# Patient Record
Sex: Female | Born: 1985 | Race: Black or African American | Hispanic: No | Marital: Married
Health system: Southern US, Community
[De-identification: ages and names within clinical notes are randomized; demographics above are authoritative.]

## PROBLEM LIST (undated history)

## (undated) DIAGNOSIS — R569 Unspecified convulsions: Secondary | ICD-10-CM

---

## 2019-07-13 ENCOUNTER — Emergency Department: Payer: 59

## 2019-07-13 ENCOUNTER — Other Ambulatory Visit: Payer: Self-pay

## 2019-07-13 ENCOUNTER — Emergency Department
Admission: EM | Admit: 2019-07-13 | Discharge: 2019-07-14 | Discharge status: Against Medical Advice | Payer: 59 | Attending: Emergency Medicine | Admitting: Emergency Medicine

## 2019-07-13 DIAGNOSIS — R569 Unspecified convulsions: Secondary | ICD-10-CM | POA: Insufficient documentation

## 2019-07-13 DIAGNOSIS — M542 Cervicalgia: Secondary | ICD-10-CM | POA: Insufficient documentation

## 2019-07-13 DIAGNOSIS — Z20822 Contact with and (suspected) exposure to covid-19: Secondary | ICD-10-CM | POA: Diagnosis not present

## 2019-07-13 HISTORY — DX: Unspecified convulsions: R56.9

## 2019-07-13 LAB — COMPREHENSIVE METABOLIC PANEL
ALT: 10 U/L (ref 0–44)
AST: 16 U/L (ref 15–41)
Albumin: 3.5 g/dL (ref 3.5–5.0)
Alkaline Phosphatase: 49 U/L (ref 38–126)
Anion gap: 7 (ref 5–15)
BUN: 7 mg/dL (ref 6–20)
CO2: 27 mmol/L (ref 22–32)
Calcium: 8.9 mg/dL (ref 8.9–10.3)
Chloride: 105 mmol/L (ref 98–111)
Creatinine, Ser: 0.78 mg/dL (ref 0.44–1.00)
GFR calc Af Amer: >60 mL/min (ref >60)
GFR calc non Af Amer: >60 mL/min (ref >60)
Glucose, Bld: 135 mg/dL — ABNORMAL HIGH (ref 70–99)
Potassium: 4 mmol/L (ref 3.5–5.1)
Sodium: 139 mmol/L (ref 135–145)
Total Bilirubin: 0.5 mg/dL (ref 0.3–1.2)
Total Protein: 6.3 g/dL — ABNORMAL LOW (ref 6.5–8.1)

## 2019-07-13 LAB — CBC WITH DIFFERENTIAL/PLATELET
Abs Immature Granulocytes: 0.03 10*3/uL (ref 0.00–0.07)
Basophils Absolute: 0 10*3/uL (ref 0.0–0.1)
Basophils Relative: 0 %
Eosinophils Absolute: 0.3 10*3/uL (ref 0.0–0.5)
Eosinophils Relative: 4 %
HCT: 36.9 % (ref 36.0–46.0)
Hemoglobin: 12.9 g/dL (ref 12.0–15.0)
Immature Granulocytes: 0 %
Lymphocytes Relative: 21 %
Lymphs Abs: 1.6 10*3/uL (ref 0.7–4.0)
MCH: 30.8 pg (ref 26.0–34.0)
MCHC: 35 g/dL (ref 30.0–36.0)
MCV: 88.1 fL (ref 80.0–100.0)
Monocytes Absolute: 0.7 10*3/uL (ref 0.1–1.0)
Monocytes Relative: 9 %
Neutro Abs: 5.1 10*3/uL (ref 1.7–7.7)
Neutrophils Relative %: 66 %
Platelets: 194 10*3/uL (ref 150–400)
RBC: 4.19 MIL/uL (ref 3.87–5.11)
RDW: 12.4 % (ref 11.5–15.5)
WBC: 7.8 10*3/uL (ref 4.0–10.5)
nRBC: 0 % (ref 0.0–0.2)

## 2019-07-13 LAB — HCG, QUANTITATIVE, PREGNANCY: hCG, Beta Chain, Quant, S: <1 m[IU]/mL (ref <5)

## 2019-07-13 LAB — SARS CORONAVIRUS 2 BY RT PCR (HOSPITAL ORDER, PERFORMED IN ~~LOC~~ HOSPITAL LAB): SARS Coronavirus 2: NEGATIVE

## 2019-07-13 LAB — MAGNESIUM: Magnesium: 1.7 mg/dL (ref 1.7–2.4)

## 2019-07-13 MED ORDER — ACETAMINOPHEN 500 MG PO TABS
1000.0000 mg | ORAL_TABLET | Freq: Once | ORAL | Status: AC
  Administered 2019-07-13: 1000 mg via ORAL
  Filled 2019-07-13: qty 2

## 2019-07-13 NOTE — ED Notes (Signed)
Pt resting at this time, RR even and unlabored.

## 2019-07-13 NOTE — ED Notes (Signed)
Pt provided ice water as requested.

## 2019-07-13 NOTE — ED Notes (Signed)
This RN to bedside, per pt husband at bedside, pt appeared to have a short period of seizure like activity. Pt calm at this time, answering questions appropriately, RR even and unlabored.

## 2019-07-13 NOTE — ED Provider Notes (Signed)
[facility] Emergency Department Provider Note  ____________________________________________   First MD Initiated Contact with Patient 07/13/19 1532     (approximate)  I have reviewed the triage vital signs and the nursing notes.   HISTORY  Chief Complaint Seizures    HPI Katherine King is a 34 y.o. female who reports having a history of seizures on amitriptyline who comes in for concerns for seizures.  Patient reports having a history of seizures.  She is not sure if they are epileptic or nonepileptic in nature.  She states that they are still not sure.  She states that she felt fine this morning.  She had 4 seizures with EMS that they described as generalized tonic-clonic lasted a few seconds at that stopped.  Then there was only a few seconds in between them it would restart again.  Nothing made them better, nothing made them worse.  Eventually she was given 2 mg of IV Versed and her seizures have stopped.  They did think that she was postictal afterwards.  She denies any chest pain, abdominal pain.          Past Medical History:  Diagnosis Date   Seizures (HCC)     There are no problems to display for this patient.    Prior to Admission medications   Not on File    Allergies Patient has no allergy information on record.  No family history on file.  Social History Denies daily drinking, drugs   Review of Systems Constitutional: No fever/chills Eyes: No visual changes. ENT: No sore throat. Cardiovascular: Denies chest pain. Respiratory: Denies shortness of breath. Gastrointestinal: No abdominal pain.  No nausea, no vomiting.  No diarrhea.  No constipation. Genitourinary: Negative for dysuria. Musculoskeletal: Negative for back pain. Positive neck pain Skin: Negative for rash. Neurological: Negative for headaches, focal weakness or numbness. Positive seizure All other ROS  negative ____________________________________________   PHYSICAL EXAM:  VITAL SIGNS: Blood pressure 117/85, pulse 83, temperature 98.4 F (36.9 C), temperature source Oral, resp. rate 15, height 5\' 2"  (1.575 m), weight 62.1 kg, SpO2 98 %.   Constitutional: Patient is sleepy but stable to follow commands Eyes: Conjunctivae are normal. EOMI. Head: Atraumatic. Nose: No congestion/rhinnorhea. Mouth/Throat: Mucous membranes are moist.   Neck: No stridor. Trachea Midline. FROM some tenderness on the left side of her neck Cardiovascular: Normal rate, regular rhythm. Grossly normal heart sounds.  Good peripheral circulation. Respiratory: Normal respiratory effort.  No retractions. Lungs CTAB. Gastrointestinal: Soft and nontender. No distention. No abdominal bruits.  Musculoskeletal: No lower extremity tenderness nor edema.  No joint effusions. Neurologic:  Normal speech and language. No gross focal neurologic deficits are appreciated. Cranial nerves are intact. Although does appear sleepy Skin:  Skin is warm, dry and intact. No rash noted. Psychiatric: Mood and affect are normal. Speech and behavior are normal. GU: Deferred   ____________________________________________   LABS (all labs ordered are listed, but only abnormal results are displayed)  Labs Reviewed  COMPREHENSIVE METABOLIC PANEL - Abnormal; Notable for the following components:      Result Value   Glucose, Bld 135 (*)    Total Protein 6.3 (*)    All other components within normal limits  CBC WITH DIFFERENTIAL/PLATELET  MAGNESIUM  HCG, QUANTITATIVE, PREGNANCY  CBG MONITORING, ED   ____________________________________________   ED ECG REPORT I, , the attending physician, personally viewed and interpreted this ECG.  EKG is normal sinus rate of 86, no ST elevation, no T  wave inversions, normal intervals ____________________________________________  RADIOLOGY   Official radiology report(s): CT Head  Wo Contrast  Result Date: 07/13/2019 CLINICAL DATA:  Headache and left-sided neck pain after seizure. EXAM: CT HEAD WITHOUT CONTRAST CT CERVICAL SPINE WITHOUT CONTRAST TECHNIQUE: Multidetector CT imaging of the head and cervical spine was performed following the standard protocol without intravenous contrast. Multiplanar CT image reconstructions of the cervical spine were also generated. COMPARISON:  None. FINDINGS: CT HEAD FINDINGS Brain: No evidence of acute infarction, hemorrhage, hydrocephalus, extra-axial collection or mass lesion/mass effect. Vascular: No hyperdense vessel or unexpected calcification. Skull: Normal. Negative for fracture or focal lesion. Sinuses/Orbits: No acute finding. Other: None. CT CERVICAL SPINE FINDINGS Alignment: Reversal of the normal cervical lordosis. No traumatic malalignment. Skull base and vertebrae: No acute fracture. No primary bone lesion or focal pathologic process. Soft tissues and spinal canal: No prevertebral fluid or swelling. No visible canal hematoma. Disc levels:  Normal. Upper chest: Negative. Other: None. IMPRESSION: 1. Normal noncontrast head CT. 2. No acute cervical spine fracture or traumatic malalignment. 3. Reversal of the normal cervical lordosis may reflect muscle spasm. Electronically Signed   By: Obie Dredge M.D.   On: 07/13/2019 17:17   CT Cervical Spine Wo Contrast  Result Date: 07/13/2019 CLINICAL DATA:  Headache and left-sided neck pain after seizure. EXAM: CT HEAD WITHOUT CONTRAST CT CERVICAL SPINE WITHOUT CONTRAST TECHNIQUE: Multidetector CT imaging of the head and cervical spine was performed following the standard protocol without intravenous contrast. Multiplanar CT image reconstructions of the cervical spine were also generated. COMPARISON:  None. FINDINGS: CT HEAD FINDINGS Brain: No evidence of acute infarction, hemorrhage, hydrocephalus, extra-axial collection or mass lesion/mass effect. Vascular: No hyperdense vessel or unexpected  calcification. Skull: Normal. Negative for fracture or focal lesion. Sinuses/Orbits: No acute finding. Other: None. CT CERVICAL SPINE FINDINGS Alignment: Reversal of the normal cervical lordosis. No traumatic malalignment. Skull base and vertebrae: No acute fracture. No primary bone lesion or focal pathologic process. Soft tissues and spinal canal: No prevertebral fluid or swelling. No visible canal hematoma. Disc levels:  Normal. Upper chest: Negative. Other: None. IMPRESSION: 1. Normal noncontrast head CT. 2. No acute cervical spine fracture or traumatic malalignment. 3. Reversal of the normal cervical lordosis may reflect muscle spasm. Electronically Signed   By: Obie Dredge M.D.   On: 07/13/2019 17:17    ____________________________________________   PROCEDURES  Procedure(s) performed (including Critical Care):  Procedures   ____________________________________________   INITIAL IMPRESSION / ASSESSMENT AND PLAN / ED COURSE  Katherine King was evaluated in Emergency Department on 07/13/2019 for the symptoms described in the history of present illness. She was evaluated in the context of the global COVID-19 pandemic, which necessitated consideration that the patient might be at risk for infection with the SARS-CoV-2 virus that causes COVID-19. Institutional protocols and algorithms that pertain to the evaluation of patients at risk for COVID-19 are in a state of rapid change based on information released by regulatory bodies including the CDC and federal and state organizations. These policies and algorithms were followed during the patient's care in the ED.     Patient is a 34 year old who comes in with recurrent seizures. Possibly history of nonepileptic seizures but unable to see her records from [place]. Will get labs to evaluate for Electra abnormalities, AKI, CT head evaluate for intracranial hemorrhage, mass and CT cervical evaluate for cervical fracture. She is otherwise able to  follow commands and has a normal neuro exam although does appear sleepy which could  be secondary to being postictal versus being from the versed. We will needed to discuss with the [facility] team. No fever to suggest meningitis  Labs are reassuring, CT scans are negative.   5:38 PM discussed with the [facility] transfer center. D/w [facility] - nonepileptic seizures. Last EEG October 2019 48 hours Normal.  Last seen by neurology - spells begin with headaches, upper extremity shaking, regained consciousness. No spells capture on EEG.  She has never had frequent enough spells to admit for video EEG.  Given she is now having more frequent spells I have requested potential transfer for video EEG   6:20 PM discussed with Dr. [facility] Salvia at [facility] who would want patient transferred for video EEG.  Unfortunately do not have any beds I discussed with Dr. Fransisco Beau over at [facility] who also agrees.  I spoke with patient who has not had an episode for the past hour.  She states that she feels still feels very sleepy and weak but she is willing to go over there given she is never had recurrent episodes like this before. Patient accepted by Dr. Jilda Roche At [facility]. We will give some Tylenol for her headache.         ____________________________________________   FINAL CLINICAL IMPRESSION(S) / ED DIAGNOSES   Final diagnoses:  Seizure (HCC)      MEDICATIONS GIVEN DURING THIS VISIT:  Medications  acetaminophen (TYLENOL) tablet 1,000 mg (has no administration in time range)     ED Discharge Orders    None       Note:  This document was prepared using Dragon voice recognition software and may include unintentional dictation errors.   Concha Se, MD 07/13/19 [phone]

## 2019-07-13 NOTE — ED Notes (Signed)
Pt alert and oriented. Provided warm blanket and lights dimmed for comfort. Seizure pads in place, call bell within reach, stretched locked in lowest position. Husband at bedside

## 2019-07-13 NOTE — ED Notes (Signed)
Pt assisted with bed pan. Stretcher locked in lowest position, seizure pads in place. Husband at bedside. Denies needs at this time

## 2019-07-13 NOTE — ED Notes (Signed)
Pt noted to have seizure like activity. Dr Fuller Plan to bedside. Seizure like activity last less than one minute. RR even and unlabored. SpO2 98%

## 2019-07-13 NOTE — ED Notes (Signed)
Seizure pads in place. Husband at bedside. Pt resting at this time

## 2019-07-13 NOTE — ED Triage Notes (Signed)
Pt to ED via [facility] from home for seizures. Ems reports 4 witnessed seizures enroute, 2 versed given. 20 G to L AC. VSS.  Pt reports 7/10 pain to head and L side neck Pt and husband denies pt hitting head during seizure

## 2019-07-14 NOTE — Discharge Instructions (Signed)
Seizures may happen at any time. It is important to take certain precautions to maintain your safety.  ° °Follow up with your doctor in 1-3 days. ° °If you were started on a seizure medication, take it as prescribed. ° °During a seizure, a person may injure himself or herself. Seizure precautions are guidelines that a person can follow in order to minimize injury during a seizure. For any activity, it is important to ask, "What would happen if I had a seizure while doing this?" Follow the below precautions.  ° °Bathroom Safety  °A person with seizures may want to shower instead of bathe to avoid accidental drowning. If falls occur during the patient's typical seizure, a person should use a shower seat, preferably one with a safety strap.  °Use nonskid strips in your shower or tub.  °Never use electrical equipment near water. This prevents accidental electrocution.  °Consider changing glass in shower doors to shatterproof glass. ° °Kitchen Safety °If possible, cook when someone else is nearby.  °Use the back burners of the stove to prevent accidental burns.  °Use shatterproof containers as much as possible. For instance, sauces can be transferred from glass bottles to plastic containers for use.  °Limit time that is required using knives or other sharp objects. If possible, buy foods that are already cut, or ask someone to help in meal preparation.  ° °General Safety at Home °Do not smoke or light fires in the fireplace unless someone else is present.  °Do not use space heaters that can be accidentally overturned.  °When alone, avoid using step stools or ladders, and do not clean rooftop gutters.  °Purchase power tools and motorized lawn equipment which have a safety switch that will stop the machine if you release the handle (a 'dead man's' switch).  ° °Driving and Transportation °DO NOT DRIVE UNTIL YOU ARE CLEARED BY A NEUROLOGIST and/or you have permission to drive from your state's Department of Motor Vehicles   °(DMV). Each state has different laws. Please refer to the following link on the Epilepsy Foundation of America's website for more information: http://www.epilepsyfoundation.org/answerplace/Social/driving/drivingu.cfm  °If you ride a bicycle, wear a helmet and any other necessary protective gear.  °When taking public transportation like the bus or subway, stay clear of the platform edge.  ° °Outdoor and Sports Safety °Swimming is okay, but does present certain risks. Never swim alone, and tell friends what to do if you have a seizure while swimming.  °Wear appropriate protective equipment.  °Ski with a friend. If a seizure occurs, your friend can seek help, if needed. He or she can also help to get you out of the cold. Consider using a safety hook or belt while riding the ski lift.  ° ° °

## 2019-07-14 NOTE — ED Notes (Signed)
Pt signed AMA form with this RN signing as a witness and the EDP Don Perking signing as verification

## 2019-07-14 NOTE — ED Provider Notes (Signed)
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  Unfortunately patient has been here for 12 hours with no beds available at [facility].  She wishes to go home and follow-up with her neurologist who is at [facility].  Patient has had extensive work-up for the seizures and currently is not on any antiepileptic medications.  Her work-up here is unremarkable with no electrolyte derangements that would explain increased number of these episodes.  I discussed seizure precautions with patient.  I will discharge her to the care of her husband who was at bedside.  Recommended returning to the emergency room as needed for seizure episodes.   Don Perking, [place], MD 07/14/19 [phone]

## 2020-01-15 ENCOUNTER — Other Ambulatory Visit: Payer: 59

## 2020-01-15 DIAGNOSIS — Z20822 Contact with and (suspected) exposure to covid-19: Secondary | ICD-10-CM

## 2020-01-16 LAB — NOVEL CORONAVIRUS, NAA: SARS-CoV-2, NAA: DETECTED — AB

## 2020-01-16 LAB — SARS-COV-2, NAA 2 DAY TAT

## 2020-01-17 ENCOUNTER — Other Ambulatory Visit: Payer: 59

## 2021-09-18 IMAGING — CT CT HEAD W/O CM
3 series · 16 of 46 positions shown, 19 images · non-contrast
Comparison: None.

CLINICAL DATA: Headache and left-sided neck pain after seizure.

EXAM:
CT HEAD WITHOUT CONTRAST
CT CERVICAL SPINE WITHOUT CONTRAST
TECHNIQUE: Multidetector CT imaging of the head and cervical spine was
performed following the standard protocol without intravenous
contrast. Multiplanar CT image reconstructions of the cervical spine
were also generated.

[Series 3: head wo · axial · 0.39mm/px · z∈[+299,+419]mm · 10 of 29 slices shown, 13 images]
[im 3/29  brain]
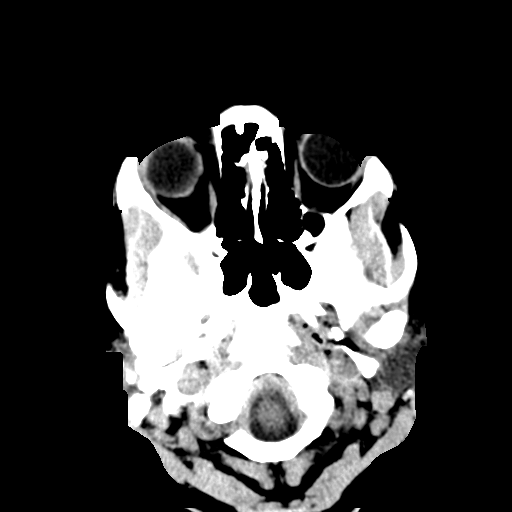
[im 3/29  bone]
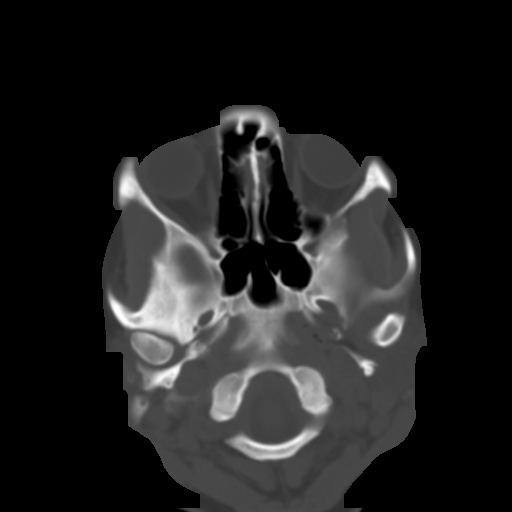
[im 6/29  brain]
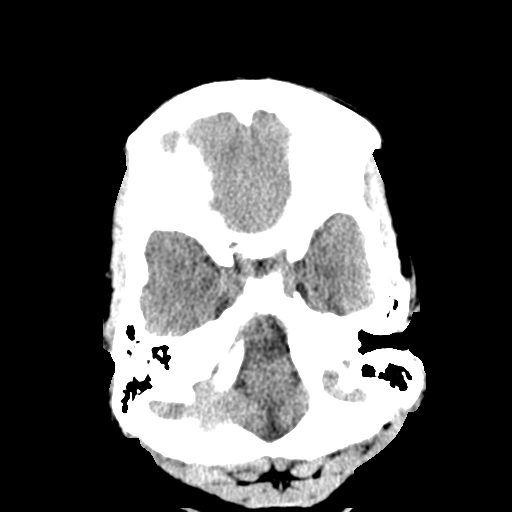
[im 8/29  brain]
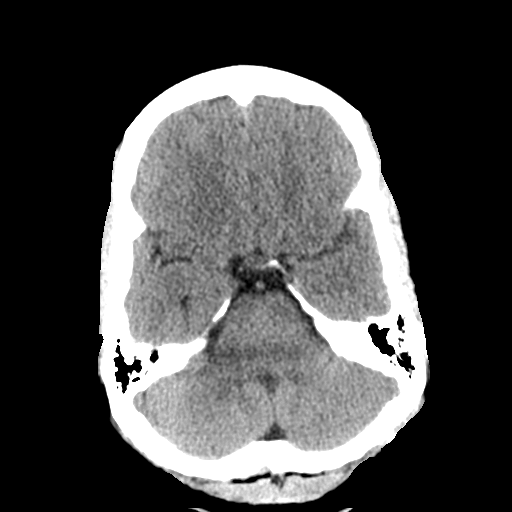
[im 11/29  brain]
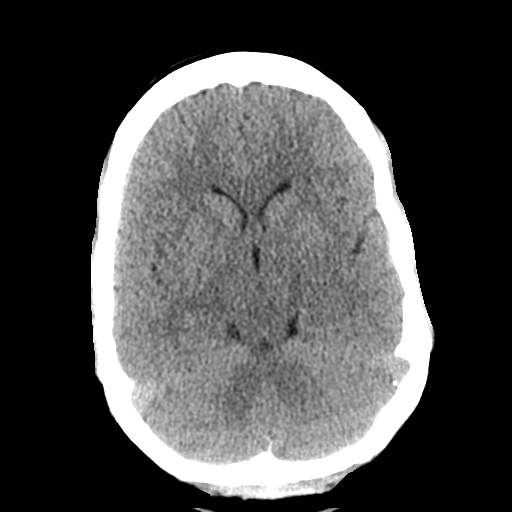
[im 14/29  brain]
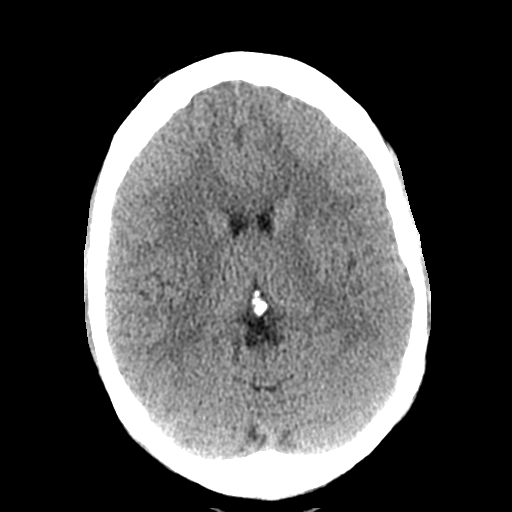
[im 14/29  bone]
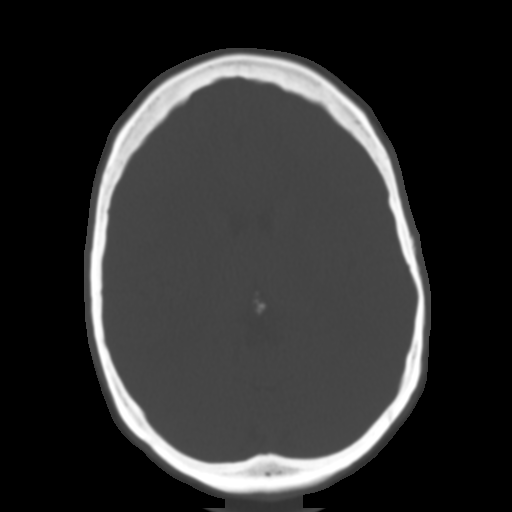
[im 16/29  brain]
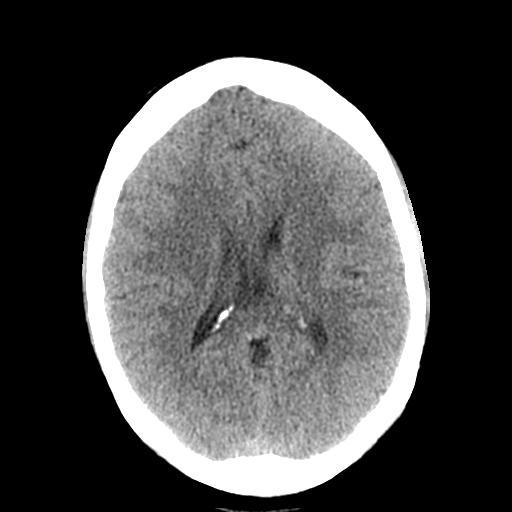
[im 19/29  brain]
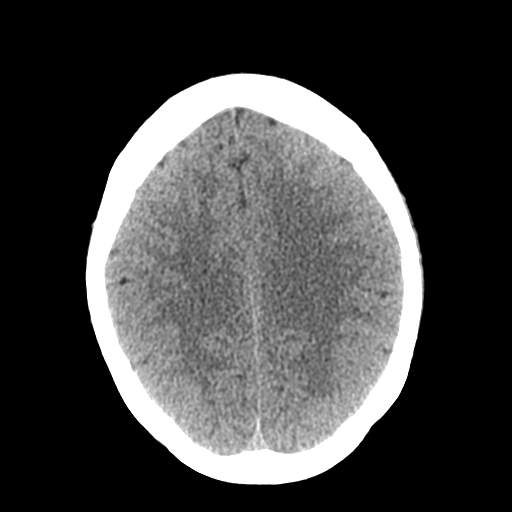
[im 22/29  brain]
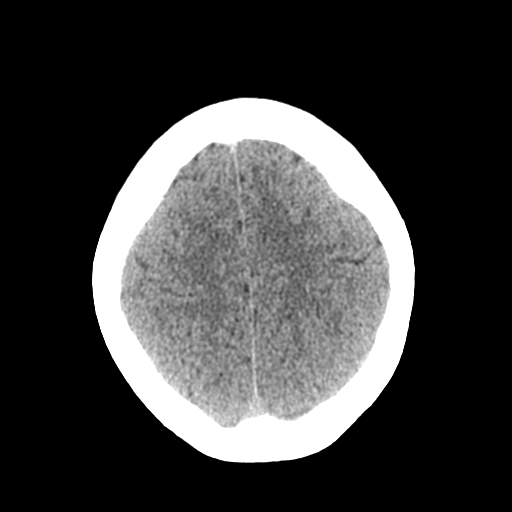
[im 24/29  brain]
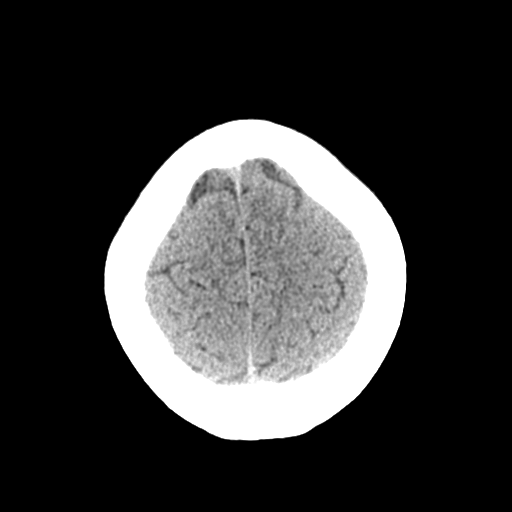
[im 24/29  bone]
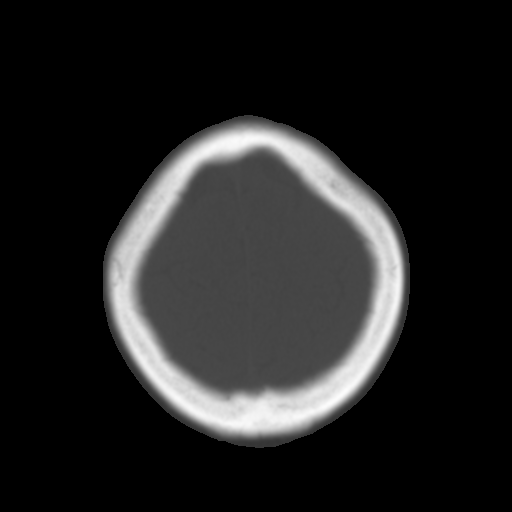
[im 27/29  brain]
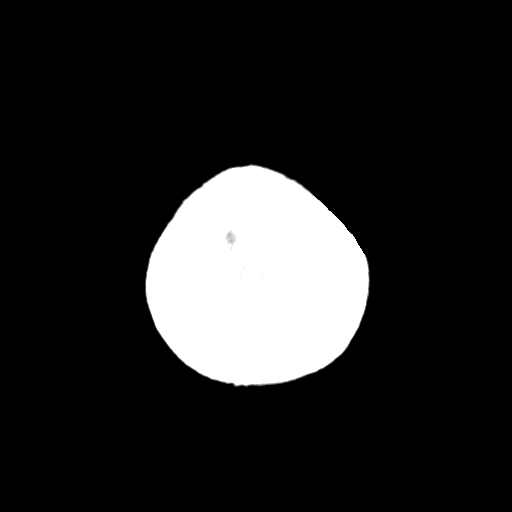

[Series 4: coronal soft tissue · coronal · 0.29mm/px · 3 of 63 slices shown]
[im 21/63  brain]
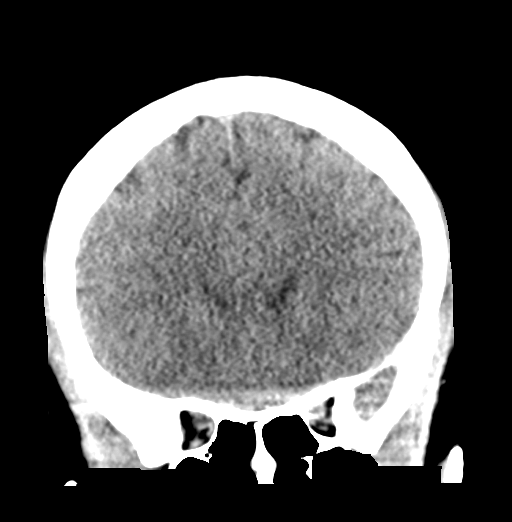
[im 28/63  brain]
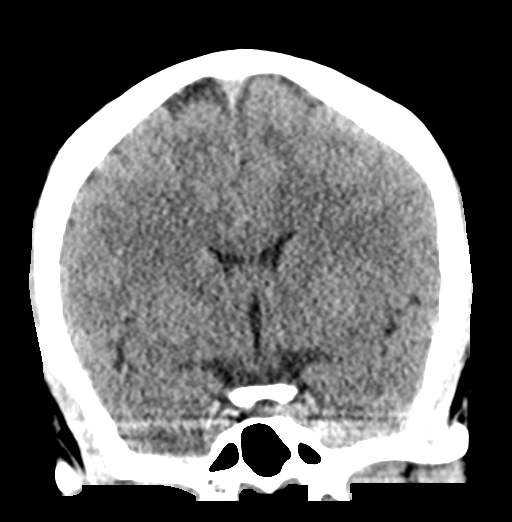
[im 35/63  brain]
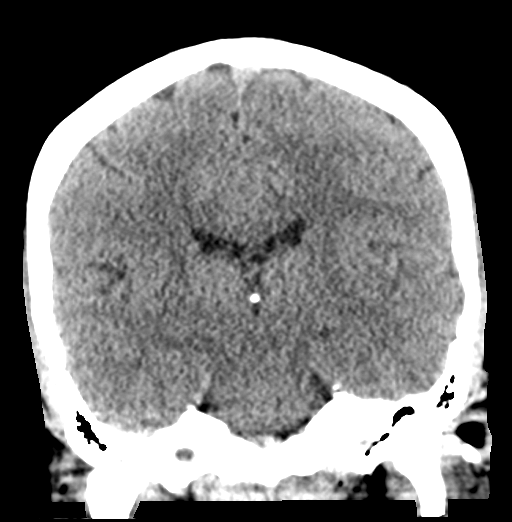

[Series 5: sagittal soft tissue · sagittal · 0.30mm/px · 3 of 51 slices shown]
[im 17/51  brain]
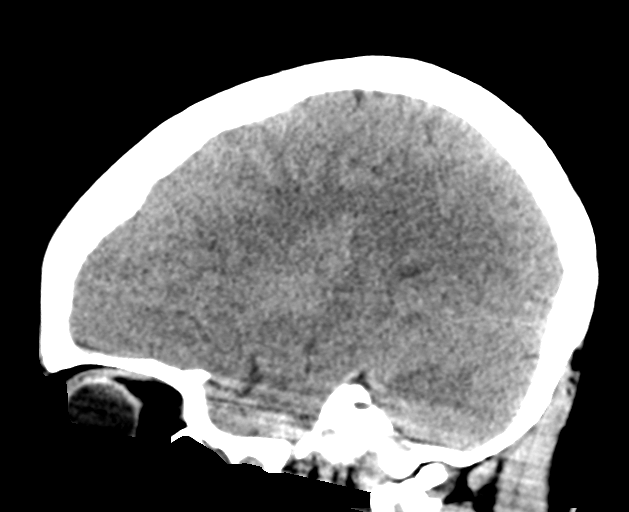
[im 26/51  brain]
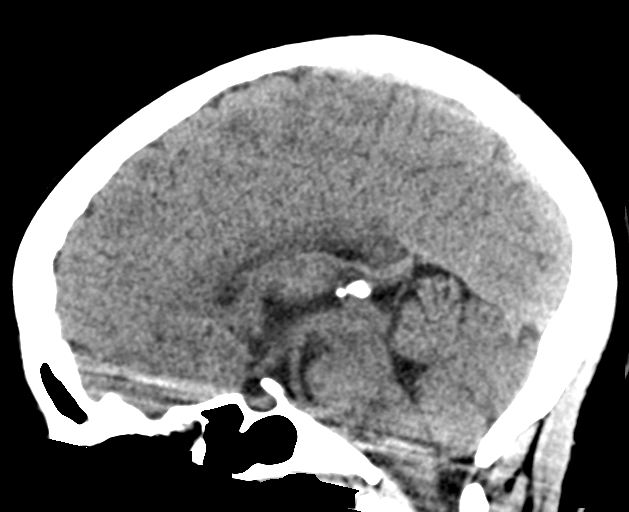
[im 34/51  brain]
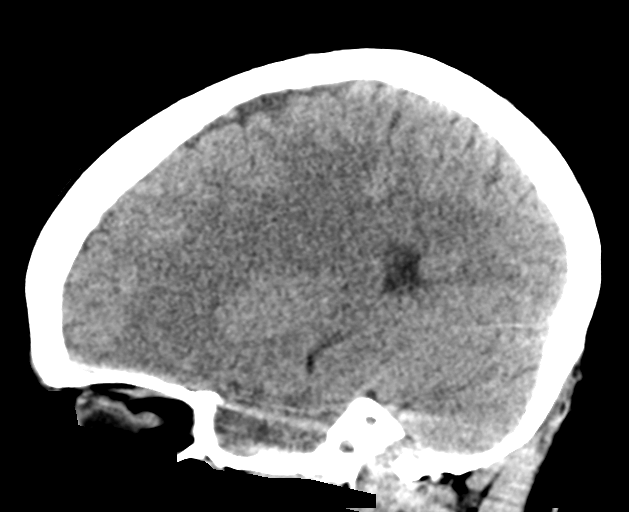

[16 of 46 positions shown; findings below may reference images not displayed]

FINDINGS: CT HEAD FINDINGS

Brain: No evidence of acute infarction, hemorrhage, hydrocephalus,
extra-axial collection or mass lesion/mass effect.

Vascular: No hyperdense vessel or unexpected calcification.

Skull: Normal. Negative for fracture or focal lesion.

Sinuses/Orbits: No acute finding.

Other: None.

CT CERVICAL SPINE FINDINGS

Alignment: Reversal of the normal cervical lordosis. No traumatic
malalignment.

Skull base and vertebrae: No acute fracture. No primary bone lesion
or focal pathologic process.

Soft tissues and spinal canal: No prevertebral fluid or swelling. No
visible canal hematoma.

Disc levels:  Normal.

Upper chest: Negative.

Other: None.
IMPRESSION: 1. Normal noncontrast head CT.
2. No acute cervical spine fracture or traumatic malalignment.
3. Reversal of the normal cervical lordosis may reflect muscle
spasm.

## 2021-10-04 ENCOUNTER — Other Ambulatory Visit: Payer: Self-pay

## 2021-10-04 ENCOUNTER — Emergency Department
Admission: EM | Admit: 2021-10-04 | Discharge: 2021-10-04 | Disposition: A | Discharge status: Home / Self Care | Payer: 59 | Attending: Student in an Organized Health Care Education/Training Program | Admitting: Student in an Organized Health Care Education/Training Program

## 2021-10-04 ENCOUNTER — Encounter: Payer: Self-pay | Admitting: Emergency Medicine

## 2021-10-04 DIAGNOSIS — D839 Common variable immunodeficiency, unspecified: Secondary | ICD-10-CM | POA: Diagnosis not present

## 2021-10-04 DIAGNOSIS — U071 COVID-19: Secondary | ICD-10-CM | POA: Diagnosis not present

## 2021-10-04 DIAGNOSIS — R519 Headache, unspecified: Secondary | ICD-10-CM | POA: Diagnosis present

## 2021-10-04 LAB — CBC WITH DIFFERENTIAL/PLATELET
Abs Immature Granulocytes: 0.01 10*3/uL (ref 0.00–0.07)
Basophils Absolute: 0 10*3/uL (ref 0.0–0.1)
Basophils Relative: 0 %
Eosinophils Absolute: 0.3 10*3/uL (ref 0.0–0.5)
Eosinophils Relative: 6 %
HCT: 42 % (ref 36.0–46.0)
Hemoglobin: 13.5 g/dL (ref 12.0–15.0)
Immature Granulocytes: 0 %
Lymphocytes Relative: 30 %
Lymphs Abs: 1.5 10*3/uL (ref 0.7–4.0)
MCH: 29.4 pg (ref 26.0–34.0)
MCHC: 32.1 g/dL (ref 30.0–36.0)
MCV: 91.5 fL (ref 80.0–100.0)
Monocytes Absolute: 0.5 10*3/uL (ref 0.1–1.0)
Monocytes Relative: 11 %
Neutro Abs: 2.6 10*3/uL (ref 1.7–7.7)
Neutrophils Relative %: 53 %
Platelets: 208 10*3/uL (ref 150–400)
RBC: 4.59 MIL/uL (ref 3.87–5.11)
RDW: 12.8 % (ref 11.5–15.5)
WBC: 4.9 10*3/uL (ref 4.0–10.5)
nRBC: 0 % (ref 0.0–0.2)

## 2021-10-04 LAB — BASIC METABOLIC PANEL
Anion gap: 9 (ref 5–15)
BUN: 6 mg/dL (ref 6–20)
CO2: 27 mmol/L (ref 22–32)
Calcium: 9.5 mg/dL (ref 8.9–10.3)
Chloride: 105 mmol/L (ref 98–111)
Creatinine, Ser: 0.87 mg/dL (ref 0.44–1.00)
GFR, Estimated: >60 mL/min (ref >60)
Glucose, Bld: 110 mg/dL — ABNORMAL HIGH (ref 70–99)
Potassium: 4.2 mmol/L (ref 3.5–5.1)
Sodium: 141 mmol/L (ref 135–145)

## 2021-10-04 MED ORDER — LACTATED RINGERS IV BOLUS
1000.0000 mL | Freq: Once | INTRAVENOUS | Status: AC
  Administered 2021-10-04: 1000 mL via INTRAVENOUS

## 2021-10-04 MED ORDER — NIRMATRELVIR/RITONAVIR (PAXLOVID)TABLET: 3.0000 | ORAL_TABLET | Freq: Two times a day (BID) | ORAL | 0 refills | Status: AC

## 2021-10-04 NOTE — ED Triage Notes (Signed)
Pt via POV from home. Pt c/o cough, chills, and generalized body aches states she took an at home COVID test and it was positive on Thursday. Pt is A&OX4 and NAD

## 2021-10-04 NOTE — ED Provider Notes (Signed)
[facility] Provider Note    Event Date/Time   First MD Initiated Contact with Patient 10/04/21 1422     (approximate)   History   Cough   HPI  Katherine King is a 36 y.o. female with history of common variable immunodeficienty disorder presents to the emergency department for evaluation of generalized fatigue/weakness, headache, body aches after being positive for COVID 2 days ago.  Her immunologist had advised that she needs to have monoclonal antibodies if she is diagnosed with COVID due to her immunodeficiency.       Physical Exam   Triage Vital Signs: ED Triage Vitals  Enc Vitals Group     BP 10/04/21 1307 (!) 131/90     Pulse Rate 10/04/21 1307 77     Resp 10/04/21 1307 18     Temp 10/04/21 1307 98.4 F (36.9 C)     Temp Source 10/04/21 1307 Oral     SpO2 10/04/21 1307 100 %     Weight 10/04/21 1307 145 lb (65.8 kg)     Height 10/04/21 1245 5\' 2"  (1.575 m)     Head Circumference --      Peak Flow --      Pain Score 10/04/21 1245 10     Pain Loc --      Pain Edu? --      Excl. in GC? --     Most recent vital signs: Vitals:   10/04/21 1307  BP: (!) 131/90  Pulse: 77  Resp: 18  Temp: 98.4 F (36.9 C)  SpO2: 100%     General: Awake, no distress.  CV:  Good peripheral perfusion.  Resp:  Normal effort. Breath sounds clear Abd:  No distention.  Other:  Tonsils flat without erythema or exudate.   ED Results / Procedures / Treatments   Labs (all labs ordered are listed, but only abnormal results are displayed) Labs Reviewed  BASIC METABOLIC PANEL - Abnormal; Notable for the following components:      Result Value   Glucose, Bld 110 (*)    All other components within normal limits  CBC WITH DIFFERENTIAL/PLATELET     EKG     RADIOLOGY   PROCEDURES:  Critical Care performed: No  Procedures   MEDICATIONS ORDERED IN ED: Medications  lactated ringers bolus 1,000 mL (0 mLs Intravenous Stopped 10/04/21 1656)      IMPRESSION / MDM / ASSESSMENT AND PLAN / ED COURSE  I reviewed the triage vital signs and the nursing notes.                              Differential diagnosis includes, but is not limited to, acute viral syndrome, COVID  Patient's presentation is most consistent with acute complicated illness / injury requiring diagnostic workup.  36 year old female presenting to the emergency department for treatment of COVID due to her immunodeficiency.  At this time, she is stable and will not require admission.  I did contact pharmacy who advised that Paxlovid is being prescribed for those who do not have any contraindications and have immunodeficiencies.  Renal function is normal and patient not currently taking any medications that are contraindicated with use of Paxlovid therefore it was submitted to her pharmacy.  She was encouraged to follow-up with her immunologist.  If symptoms change or worsen and she is unable to schedule an appointment she is to return to the emergency department.  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   Victorino Dike, FNP 10/04/21 1934    Merlyn Lot, MD 10/07/21 [phone]

## 2021-10-04 NOTE — Discharge Instructions (Addendum)
Please call and schedule an appointment with your primary care provider or immunology specialist.  Return to the emergency department for symptoms of change or worsen or for new concerns if you are unable to schedule an appointment.

## 2022-10-26 ENCOUNTER — Emergency Department
Admission: EM | Admit: 2022-10-26 | Discharge: 2022-10-26 | Disposition: A | Discharge status: Home / Self Care | Payer: 59 | Attending: Emergency Medicine | Admitting: Emergency Medicine

## 2022-10-26 ENCOUNTER — Other Ambulatory Visit: Payer: Self-pay

## 2022-10-26 ENCOUNTER — Emergency Department: Payer: 59

## 2022-10-26 DIAGNOSIS — T887XXA Unspecified adverse effect of drug or medicament, initial encounter: Secondary | ICD-10-CM | POA: Diagnosis not present

## 2022-10-26 DIAGNOSIS — R079 Chest pain, unspecified: Secondary | ICD-10-CM | POA: Diagnosis not present

## 2022-10-26 DIAGNOSIS — R0602 Shortness of breath: Secondary | ICD-10-CM | POA: Diagnosis present

## 2022-10-26 DIAGNOSIS — L509 Urticaria, unspecified: Secondary | ICD-10-CM | POA: Insufficient documentation

## 2022-10-26 LAB — CBC
HCT: 40.4 % (ref 36.0–46.0)
Hemoglobin: 13.5 g/dL (ref 12.0–15.0)
MCH: 29.6 pg (ref 26.0–34.0)
MCHC: 33.4 g/dL (ref 30.0–36.0)
MCV: 88.6 fL (ref 80.0–100.0)
Platelets: 265 10*3/uL (ref 150–400)
RBC: 4.56 MIL/uL (ref 3.87–5.11)
RDW: 12.9 % (ref 11.5–15.5)
WBC: 8 10*3/uL (ref 4.0–10.5)
nRBC: 0 % (ref 0.0–0.2)

## 2022-10-26 LAB — TROPONIN I (HIGH SENSITIVITY)
Troponin I (High Sensitivity): 3 ng/L (ref <18)
Troponin I (High Sensitivity): 3 ng/L (ref <18)

## 2022-10-26 LAB — BASIC METABOLIC PANEL
Anion gap: 10 (ref 5–15)
BUN: 11 mg/dL (ref 6–20)
CO2: 24 mmol/L (ref 22–32)
Calcium: 9.6 mg/dL (ref 8.9–10.3)
Chloride: 101 mmol/L (ref 98–111)
Creatinine, Ser: 0.8 mg/dL (ref 0.44–1.00)
GFR, Estimated: >60 mL/min (ref >60)
Glucose, Bld: 133 mg/dL — ABNORMAL HIGH (ref 70–99)
Potassium: 3.4 mmol/L — ABNORMAL LOW (ref 3.5–5.1)
Sodium: 135 mmol/L (ref 135–145)

## 2022-10-26 NOTE — ED Notes (Signed)
ED Provider at bedside. 

## 2022-10-26 NOTE — ED Provider Notes (Signed)
[facility] Provider Note   Event Date/Time   First MD Initiated Contact with Patient 10/26/22 1850     (approximate) History  Shortness of Breath and Chest Pain  HPI Katherine King is a 37 y.o. female with past medical history of CVID who presents after an episode of shortness of breath while taking her infusion medication (cuvitru) today.  Patient states that this is a subcutaneous medication that she takes every 14 days and has taken before multiple times with no symptoms similar to what she experienced today however she has had hives.  Patient states that the symptoms persisted significantly for approximately 30 minutes and have been slowly improving since that time.  Patient also endorses associated mild chest pain. ROS: Patient currently denies any vision changes, tinnitus, difficulty speaking, facial droop, sore throat, abdominal pain, nausea/vomiting/diarrhea, dysuria, or weakness/numbness/paresthesias in any extremity   Physical Exam  Triage Vital Signs: ED Triage Vitals  Encounter Vitals Group     BP 10/26/22 1502 113/73     Systolic BP Percentile --      Diastolic BP Percentile --      Pulse Rate 10/26/22 1502 68     Resp 10/26/22 1502 20     Temp 10/26/22 1502 98.3 F (36.8 C)     Temp Source 10/26/22 1502 Oral     SpO2 10/26/22 1502 100 %     Weight --      Height --      Head Circumference --      Peak Flow --      Pain Score 10/26/22 1436 10     Pain Loc --      Pain Education --      Exclude from Growth Chart --    Most recent vital signs: Vitals:   10/26/22 1502  BP: 113/73  Pulse: 68  Resp: 20  Temp: 98.3 F (36.8 C)  SpO2: 100%   General: Awake, oriented x4. CV:  Good peripheral perfusion.  Resp:  Normal effort.  Abd:  No distention.  Other:  Middle-aged overweight African-American female resting comfortably in no acute distress ED Results / Procedures / Treatments  Labs (all labs ordered are listed, but only  abnormal results are displayed) Labs Reviewed  BASIC METABOLIC PANEL - Abnormal; Notable for the following components:      Result Value   Potassium 3.4 (*)    Glucose, Bld 133 (*)    All other components within normal limits  CBC  POC URINE PREG, ED  TROPONIN I (HIGH SENSITIVITY)  TROPONIN I (HIGH SENSITIVITY)   EKG ED ECG REPORT I, Merwyn Katos, the attending physician, personally viewed and interpreted this ECG. Date: 10/26/2022 EKG Time: 1507 Rate: 72 Rhythm: normal sinus rhythm QRS Axis: normal Intervals: normal ST/T Wave abnormalities: normal Narrative Interpretation: no evidence of acute ischemia RADIOLOGY ED MD interpretation: 2 view chest x-ray interpreted by me shows no evidence of acute abnormalities including no pneumonia, pneumothorax, or widened mediastinum -Agree with radiology assessment Official radiology report(s): DG Chest 2 View  Result Date: 10/26/2022 CLINICAL DATA:  Chest pain. EXAM: CHEST - 2 VIEW COMPARISON:  None Available. FINDINGS: The heart size and mediastinal contours are within normal limits. Both lungs are clear. The visualized skeletal structures are unremarkable. IMPRESSION: No active cardiopulmonary disease. Electronically Signed   By: Lupita Raider M.D.   On: 10/26/2022 18:28   PROCEDURES: Critical Care performed: No .1-3 Lead EKG Interpretation  Performed by: Vicente Males,  Clent Jacks, MD Authorized by: Merwyn Katos, MD     Interpretation: normal     ECG rate:  71   ECG rate assessment: normal     Rhythm: sinus rhythm     Ectopy: none     Conduction: normal    MEDICATIONS ORDERED IN ED: Medications - No data to display IMPRESSION / MDM / ASSESSMENT AND PLAN / ED COURSE  I reviewed the triage vital signs and the nursing notes.                             The patient is on the cardiac monitor to evaluate for evidence of arrhythmia and/or significant heart rate changes. Patient's presentation is most consistent with acute presentation  with potential threat to life or bodily function. The patient is suffering from shortness of breath, but the immediate cause is not apparent.  Potential causes considered include, but are not limited to, asthma or COPD, congestive heart failure, pulmonary embolism, pneumothorax, coronary syndrome, pneumonia, and pleural effusion.  Despite the evaluation including history, exam, and testing, the cause of the shortness of breath remains unclear. However, during the ED stay, patient's condition improved, and at the time of discharge the shortness of breath is resolved, they are feeling well, and want to go home.  Patient will be discharged with strict return precautions and advice to follow up with primary MD within 24 hours for further evaluation.   FINAL CLINICAL IMPRESSION(S) / ED DIAGNOSES   Final diagnoses:  Non-dose-related adverse reaction to medication, initial encounter  Shortness of breath   Rx / DC Orders   ED Discharge Orders     None      Note:  This document was prepared using Dragon voice recognition software and may include unintentional dictation errors.   Merwyn Katos, MD 10/26/22 [phone]

## 2022-10-26 NOTE — ED Triage Notes (Signed)
Pt comes via [facility]Ms from home with c/o sob and chest pressure. Lungs clear. Pt was breathing about 40x minutes. 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 Pt unable to get fluid due to this.  Pt abdomen is distended.324 aspirin 20 g in right [facility]
